# Patient Record
Sex: Female | Born: 1990 | Race: White | Hispanic: No | Marital: Single | Smoking: Never smoker
Health system: Southern US, Community
[De-identification: ages and names within clinical notes are randomized; demographics above are authoritative.]

## PROBLEM LIST (undated history)

## (undated) DIAGNOSIS — F329 Major depressive disorder, single episode, unspecified: Secondary | ICD-10-CM

## (undated) DIAGNOSIS — M419 Scoliosis, unspecified: Secondary | ICD-10-CM

## (undated) DIAGNOSIS — F419 Anxiety disorder, unspecified: Secondary | ICD-10-CM

## (undated) DIAGNOSIS — F32A Depression, unspecified: Secondary | ICD-10-CM

## (undated) HISTORY — PX: OOPHORECTOMY: SHX86

---

## 2012-06-05 ENCOUNTER — Emergency Department (HOSPITAL_COMMUNITY)
Admission: EM | Admit: 2012-06-05 | Discharge: 2012-06-05 | Disposition: A | Discharge status: Home / Self Care | Payer: Medicaid Other | Attending: Emergency Medicine | Admitting: Emergency Medicine

## 2012-06-05 ENCOUNTER — Encounter (HOSPITAL_COMMUNITY): Payer: Self-pay | Admitting: *Deleted

## 2012-06-05 DIAGNOSIS — F411 Generalized anxiety disorder: Secondary | ICD-10-CM | POA: Insufficient documentation

## 2012-06-05 DIAGNOSIS — Z888 Allergy status to other drugs, medicaments and biological substances status: Secondary | ICD-10-CM | POA: Insufficient documentation

## 2012-06-05 DIAGNOSIS — F3289 Other specified depressive episodes: Secondary | ICD-10-CM | POA: Insufficient documentation

## 2012-06-05 DIAGNOSIS — F329 Major depressive disorder, single episode, unspecified: Secondary | ICD-10-CM | POA: Insufficient documentation

## 2012-06-05 DIAGNOSIS — R0789 Other chest pain: Secondary | ICD-10-CM

## 2012-06-05 DIAGNOSIS — R071 Chest pain on breathing: Secondary | ICD-10-CM | POA: Insufficient documentation

## 2012-06-05 DIAGNOSIS — M940 Chondrocostal junction syndrome [Tietze]: Secondary | ICD-10-CM | POA: Insufficient documentation

## 2012-06-05 HISTORY — DX: Scoliosis, unspecified: M41.9

## 2012-06-05 HISTORY — DX: Anxiety disorder, unspecified: F41.9

## 2012-06-05 HISTORY — DX: Major depressive disorder, single episode, unspecified: F32.9

## 2012-06-05 HISTORY — DX: Depression, unspecified: F32.A

## 2012-06-05 MED ORDER — FAMOTIDINE 20 MG PO TABS: 20.0000 mg | ORAL_TABLET | Freq: Two times a day (BID) | ORAL | Status: DC

## 2012-06-05 MED ORDER — FAMOTIDINE 20 MG PO TABS
20.0000 mg | ORAL_TABLET | Freq: Once | ORAL | Status: AC
  Administered 2012-06-05: 20 mg via ORAL
  Filled 2012-06-05: qty 1

## 2012-06-05 MED ORDER — PREDNISONE 20 MG PO TABS: ORAL_TABLET | ORAL | Status: DC

## 2012-06-05 MED ORDER — CYCLOBENZAPRINE HCL 10 MG PO TABS: 10.0000 mg | ORAL_TABLET | Freq: Three times a day (TID) | ORAL | Status: DC | PRN

## 2012-06-05 MED ORDER — PREDNISONE 20 MG PO TABS
60.0000 mg | ORAL_TABLET | Freq: Once | ORAL | Status: AC
  Administered 2012-06-05: 60 mg via ORAL
  Filled 2012-06-05: qty 3

## 2012-06-05 MED ORDER — CYCLOBENZAPRINE HCL 10 MG PO TABS
10.0000 mg | ORAL_TABLET | Freq: Once | ORAL | Status: AC
  Administered 2012-06-05: 10 mg via ORAL
  Filled 2012-06-05: qty 1

## 2012-06-05 NOTE — ED Notes (Signed)
Dr. Lynelle Doctor in to assess patient at this time.

## 2012-06-05 NOTE — ED Notes (Signed)
Discharge instructions given and reviewed with patient and father.  Both verbalized understanding to follow up as needed for further pain or fever.  Patient states she is unable to move her left leg; pulling leg with arms to get off bed.

## 2012-06-05 NOTE — ED Notes (Signed)
Patient significant other came out of room and asking for the name of the doctor that saw his fiance. Patient's significant other voicing that he is upset that the patient back pain and numbness and tingling was not addressed. Also states that patient's pain was not taken care of and that he is "going to be after the doctor" if something happens to her over night and they have to come back to a hospital. Delay and situation explained to family member.

## 2012-06-05 NOTE — ED Provider Notes (Cosign Needed)
History    This chart was scribed for Ward Givens, MD, MD by Smitty Pluck. The patient was seen in room APA06 and the patient's care was started at 9:44PM.   CSN: [id number]  Arrival date & time 06/05/12  1908     Chief Complaint  Patient presents with  . Chest Pain    (Consider location/radiation/quality/duration/timing/severity/associated sxs/prior treatment) The history is provided by the patient. No language interpreter was used.   Olivia Floyd is a 21 y.o. female who presents to the Emergency Department complaining of constant, moderate substernal chest pain onset 2 days ago. Pt reports the pain is sharp and throbbing. Reports lifting objects and breathing deeply aggravate the pain. Pt reports that she has SOB but thinks it is due to anxiety.She denies cough, fever.  Pt went to [facility] in [place] for back pain 3 weeks ago. She was also seen in another ED in [place] and was told she needed to go to pain management. States she is visiting a sick family member in [place]. Pt denies smoking cigarettes and drinking alcohol. Pt goes to [facility] for effexor medication. States she was last there 1 month ago.   PCP OOT  Past Medical History  Diagnosis Date  . Scoliosis   . Depression   . Anxiety     Past Surgical History  Procedure Date  . Oophorectomy     No family history on file.  History  Substance Use Topics  . Smoking status: Never Smoker   . Smokeless tobacco: Not on file  . Alcohol Use: No  lives with GM unemployed  OB History    Grav Para Term Preterm Abortions TAB SAB Ect Mult Living                  Review of Systems  Respiratory: Positive for shortness of breath.   Cardiovascular: Positive for chest pain.  All other systems reviewed and are negative.    Allergies  Ibuprofen  Home Medications   Current Outpatient Rx  Name Route Sig Dispense Refill  . ACETAMINOPHEN 500 MG PO TABS Oral Take 1,000 mg by  mouth daily as needed. *Taken as needed for pain*    . PERCOCET PO Oral Take 2 tablets by mouth every 4 (four) hours as needed. For pain    . UNKNOWN TO PATIENT Oral Take 1 tablet by mouth daily. BIRTH CONTROL-NAME IN UNKNOWN (Lo?)    . VENLAFAXINE HCL ER 37.5 MG PO CP24 Oral Take 37.5 mg by mouth daily.    . CYCLOBENZAPRINE HCL 10 MG PO TABS Oral Take 1 tablet (10 mg total) by mouth 3 (three) times daily as needed for muscle spasms. 30 tablet 0  . FAMOTIDINE 20 MG PO TABS Oral Take 1 tablet (20 mg total) by mouth 2 (two) times daily. 18 tablet 0  . PREDNISONE 20 MG PO TABS  Take 3 po QD x 2d starting tomorrow, then 2 po QD x 3d then 1 po QD x 3d 15 tablet 0    BP 125/90  Pulse 86  Temp 98.3 F (36.8 C) (Oral)  Resp 20  Ht 5\' 6"  (1.676 m)  Wt 131 lb (59.421 kg)  BMI 21.14 kg/m2  SpO2 100%  LMP 05/29/2012  Vital signs normal    Physical Exam  Nursing note and vitals reviewed. Constitutional: She is oriented to person, place, and time. She appears well-developed and well-nourished.  Non-toxic appearance. She does not appear ill. No distress.  Sleeping, had to be awakened to be examined  HENT:  Head: Normocephalic and atraumatic.  Right Ear: External ear normal.  Left Ear: External ear normal.  Nose: Nose normal. No mucosal edema or rhinorrhea.  Mouth/Throat: Oropharynx is clear and moist and mucous membranes are normal. No dental abscesses or uvula swelling.  Eyes: Conjunctivae normal and EOM are normal. Pupils are equal, round, and reactive to light.  Neck: Normal range of motion and full passive range of motion without pain. Neck supple.  Cardiovascular: Normal rate, regular rhythm and normal heart sounds.  Exam reveals no gallop and no friction rub.   No murmur heard. Pulmonary/Chest: Effort normal and breath sounds normal. No respiratory distress. She has no wheezes. She has no rhonchi. She has no rales. She exhibits tenderness (costocondral junctions bilateral with  reproducible pain upon palpation  ). She exhibits no crepitus.  Abdominal: Soft. Normal appearance and bowel sounds are normal. She exhibits no distension. There is no tenderness. There is no rebound and no guarding.  Musculoskeletal: Normal range of motion. She exhibits no edema and no tenderness.       Moves all extremities well.   Neurological: She is alert and oriented to person, place, and time. She has normal strength. No cranial nerve deficit.  Skin: Skin is warm, dry and intact. No rash noted. No erythema. No pallor.  Psychiatric: She has a normal mood and affect. Her speech is normal and behavior is normal. Her mood appears not anxious.    ED Course  Procedures (including critical care time) DIAGNOSTIC STUDIES: Oxygen Saturation is 100% on room air, normal by my interpretation.    COORDINATION OF CARE: 9:51 PM Discussed ED treatment with pt  9:51 PM Ordered:   Medications  predniSONE (DELTASONE) tablet 60 mg (60 mg Oral Given 06/05/12 2157)  famotidine (PEPCID) tablet 20 mg (20 mg Oral Given 06/05/12 2157)  cyclobenzaprine (FLEXERIL) tablet 10 mg (10 mg Oral Given 06/05/12 2157)   NCCSR shows 6 narcotic scripts in 6 months most in past 6 weeks.    Date: 06/05/2012  Rate: 63  Rhythm: normal sinus rhythm  QRS Axis: normal  Intervals: normal  ST/T Wave abnormalities: normal  Conduction Disutrbances:none  Narrative Interpretation:   Old EKG Reviewed: none available     1. Chest wall pain   2. Costochondritis     New Prescriptions   CYCLOBENZAPRINE (FLEXERIL) 10 MG TABLET    Take 1 tablet (10 mg total) by mouth 3 (three) times daily as needed for muscle spasms.   FAMOTIDINE (PEPCID) 20 MG TABLET    Take 1 tablet (20 mg total) by mouth 2 (two) times daily.   PREDNISONE (DELTASONE) 20 MG TABLET    Take 3 po QD x 2d starting tomorrow, then 2 po QD x 3d then 1 po QD x 3d    Plan discharge  Devoria Albe, MD, FACEP   MDM   I personally performed the services described  in this documentation, which was scribed in my presence. The recorded information has been reviewed and considered.  Devoria Albe, MD, Armando Gang    Ward Givens, MD 06/05/12 2226

## 2012-06-05 NOTE — ED Notes (Signed)
Pt reports central chest pain that started 2 days ago. Weakness to the left leg. Pt has some nausea, denies vomiting.

## 2012-06-05 NOTE — ED Notes (Signed)
Patient now reporting that her left arm is numb and tingling.

## 2012-07-28 ENCOUNTER — Encounter (HOSPITAL_COMMUNITY): Payer: Self-pay | Admitting: Emergency Medicine

## 2012-07-28 ENCOUNTER — Emergency Department (HOSPITAL_COMMUNITY)
Admission: EM | Admit: 2012-07-28 | Discharge: 2012-07-28 | Disposition: A | Discharge status: Home / Self Care | Payer: Self-pay | Attending: Emergency Medicine | Admitting: Emergency Medicine

## 2012-07-28 ENCOUNTER — Emergency Department (HOSPITAL_COMMUNITY): Payer: Self-pay

## 2012-07-28 DIAGNOSIS — S90121A Contusion of right lesser toe(s) without damage to nail, initial encounter: Secondary | ICD-10-CM

## 2012-07-28 DIAGNOSIS — Y9301 Activity, walking, marching and hiking: Secondary | ICD-10-CM | POA: Insufficient documentation

## 2012-07-28 DIAGNOSIS — W108XXA Fall (on) (from) other stairs and steps, initial encounter: Secondary | ICD-10-CM | POA: Insufficient documentation

## 2012-07-28 DIAGNOSIS — Y929 Unspecified place or not applicable: Secondary | ICD-10-CM | POA: Insufficient documentation

## 2012-07-28 DIAGNOSIS — Z8659 Personal history of other mental and behavioral disorders: Secondary | ICD-10-CM | POA: Insufficient documentation

## 2012-07-28 DIAGNOSIS — IMO0001 Reserved for inherently not codable concepts without codable children: Secondary | ICD-10-CM | POA: Insufficient documentation

## 2012-07-28 DIAGNOSIS — S90129A Contusion of unspecified lesser toe(s) without damage to nail, initial encounter: Secondary | ICD-10-CM | POA: Insufficient documentation

## 2012-07-28 MED ORDER — ACETAMINOPHEN 325 MG PO TABS
650.0000 mg | ORAL_TABLET | Freq: Once | ORAL | Status: AC
  Administered 2012-07-28: 650 mg via ORAL
  Filled 2012-07-28: qty 2

## 2012-07-28 NOTE — ED Notes (Signed)
Pt states she fell going down steps and injured her 2nd toe on the foot.

## 2012-07-28 NOTE — ED Notes (Signed)
edpa in with pt prior to nurse

## 2012-07-28 NOTE — ED Notes (Signed)
Patient transported to X-ray 

## 2012-07-29 NOTE — ED Provider Notes (Signed)
History     CSN: [id number]  Arrival date & time 07/28/12  7829   First MD Initiated Contact with Patient 07/28/12 1909      Chief Complaint  Patient presents with  . Fall  . Toe Pain    (Consider location/radiation/quality/duration/timing/severity/associated sxs/prior treatment) HPI Comments: Olivia Floyd tripped going down several steps prior to arrival and stubbed her right second toe on the landing causing severe pain and swelling.  She denies other injury except for this toe and her distal foot.  Pain is constant and throbbing and does not radiate.  She has taken no medications or treatments for this injury prior to arrival.  The history is provided by the patient.    Past Medical History  Diagnosis Date  . Scoliosis   . Depression   . Anxiety     Past Surgical History  Procedure Date  . Oophorectomy     History reviewed. No pertinent family history.  History  Substance Use Topics  . Smoking status: Never Smoker   . Smokeless tobacco: Not on file  . Alcohol Use: No    OB History    Grav Para Term Preterm Abortions TAB SAB Ect Mult Living                  Review of Systems  Musculoskeletal: Positive for arthralgias.  Skin: Negative for wound.  Neurological: Negative for weakness and numbness.    Allergies  Ibuprofen  Home Medications  No current outpatient prescriptions on file.  BP 101/70  Pulse 71  Temp 97.3 F (36.3 C) (Oral)  Resp 20  Ht 5\' 4"  (1.626 m)  Wt 125 lb (56.7 kg)  BMI 21.46 kg/m2  SpO2 100%  LMP 07/25/2012  Physical Exam  Constitutional: She appears well-developed and well-nourished.  HENT:  Head: Atraumatic.  Neck: Normal range of motion.  Cardiovascular:       Pulses equal bilaterally  Musculoskeletal: She exhibits tenderness.       Feet:       Right distal second toe appears erythematous and slightly swollen.  Skin is intact.  Pedal pulses intact.  Less than 3 second cap refill of his toe.  Neurological: She  is alert. She has normal strength. She displays normal reflexes. No sensory deficit.       Equal strength  Skin: Skin is warm and dry.  Psychiatric: She has a normal mood and affect.    ED Course  Procedures (including critical care time)  Labs Reviewed - No data to display Dg Foot Complete Right  07/28/2012  *RADIOLOGY REPORT*  Clinical Data: Tripped injuring second toe  RIGHT FOOT COMPLETE - 3+ VIEW  Comparison: None  Findings: Bone mineralization normal. Joint spaces preserved. No fracture, dislocation, or bone destruction.  IMPRESSION: Normal exam.   Original Report Authenticated By: Ulyses Southward, M.D.      1. Contusion of toe of right foot       MDM  X-rays reviewed normal.  Patient was placed in a postop shoe.  Encouraged ice, elevation.  Recommend Tylenol for pain relief.  Followup with PCP for recheck if not improving over the next week.        Burgess Amor, [place] 07/29/12 [phone]

## 2012-07-29 NOTE — ED Provider Notes (Signed)
Medical screening examination/treatment/procedure(s) were performed by non-physician practitioner and as supervising physician I was immediately available for consultation/collaboration.   Benny Lennert, MD 07/29/12 [phone]

## 2013-10-07 IMAGING — CR DG FOOT COMPLETE 3+V*R*
3 series · 3 of 3 positions shown · non-contrast
Comparison: None

CLINICAL DATA: Tripped injuring second toe

RIGHT FOOT COMPLETE - 3+ VIEW

[view not recorded (1 of 3)]
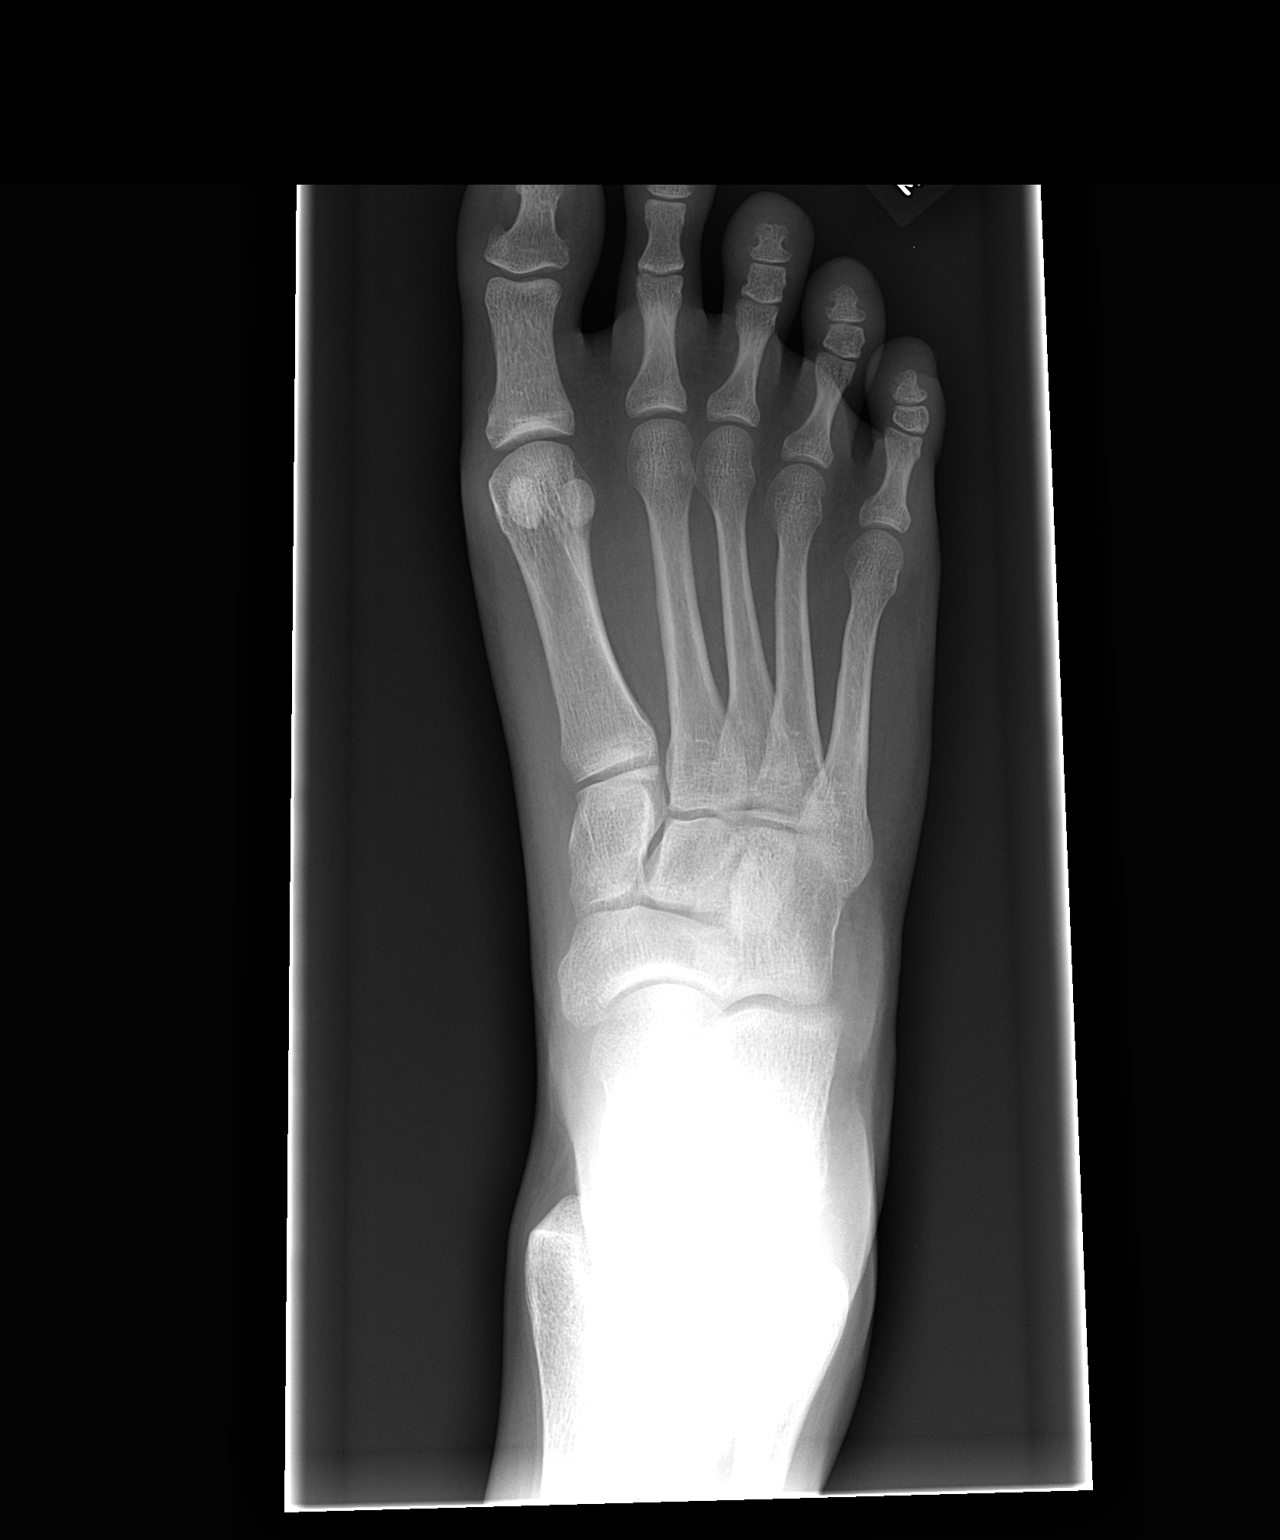

[view not recorded (2 of 3)]
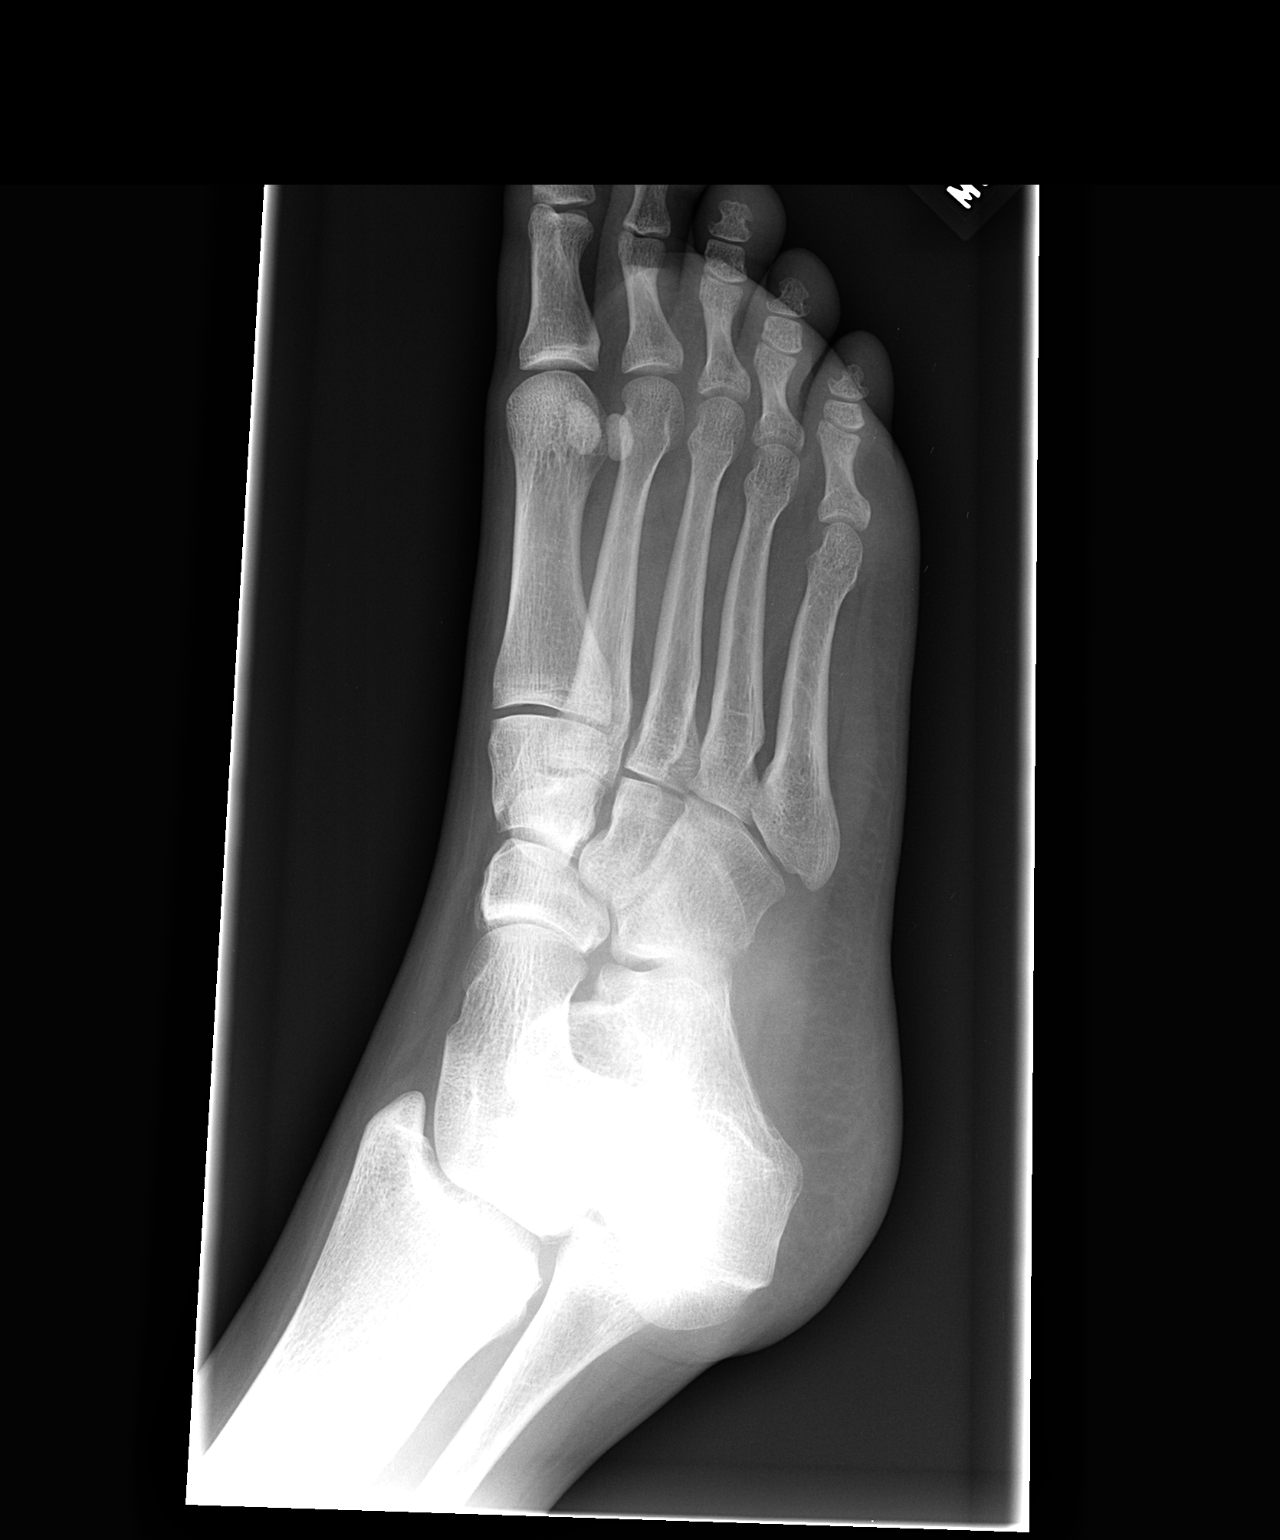

[view not recorded (3 of 3)]
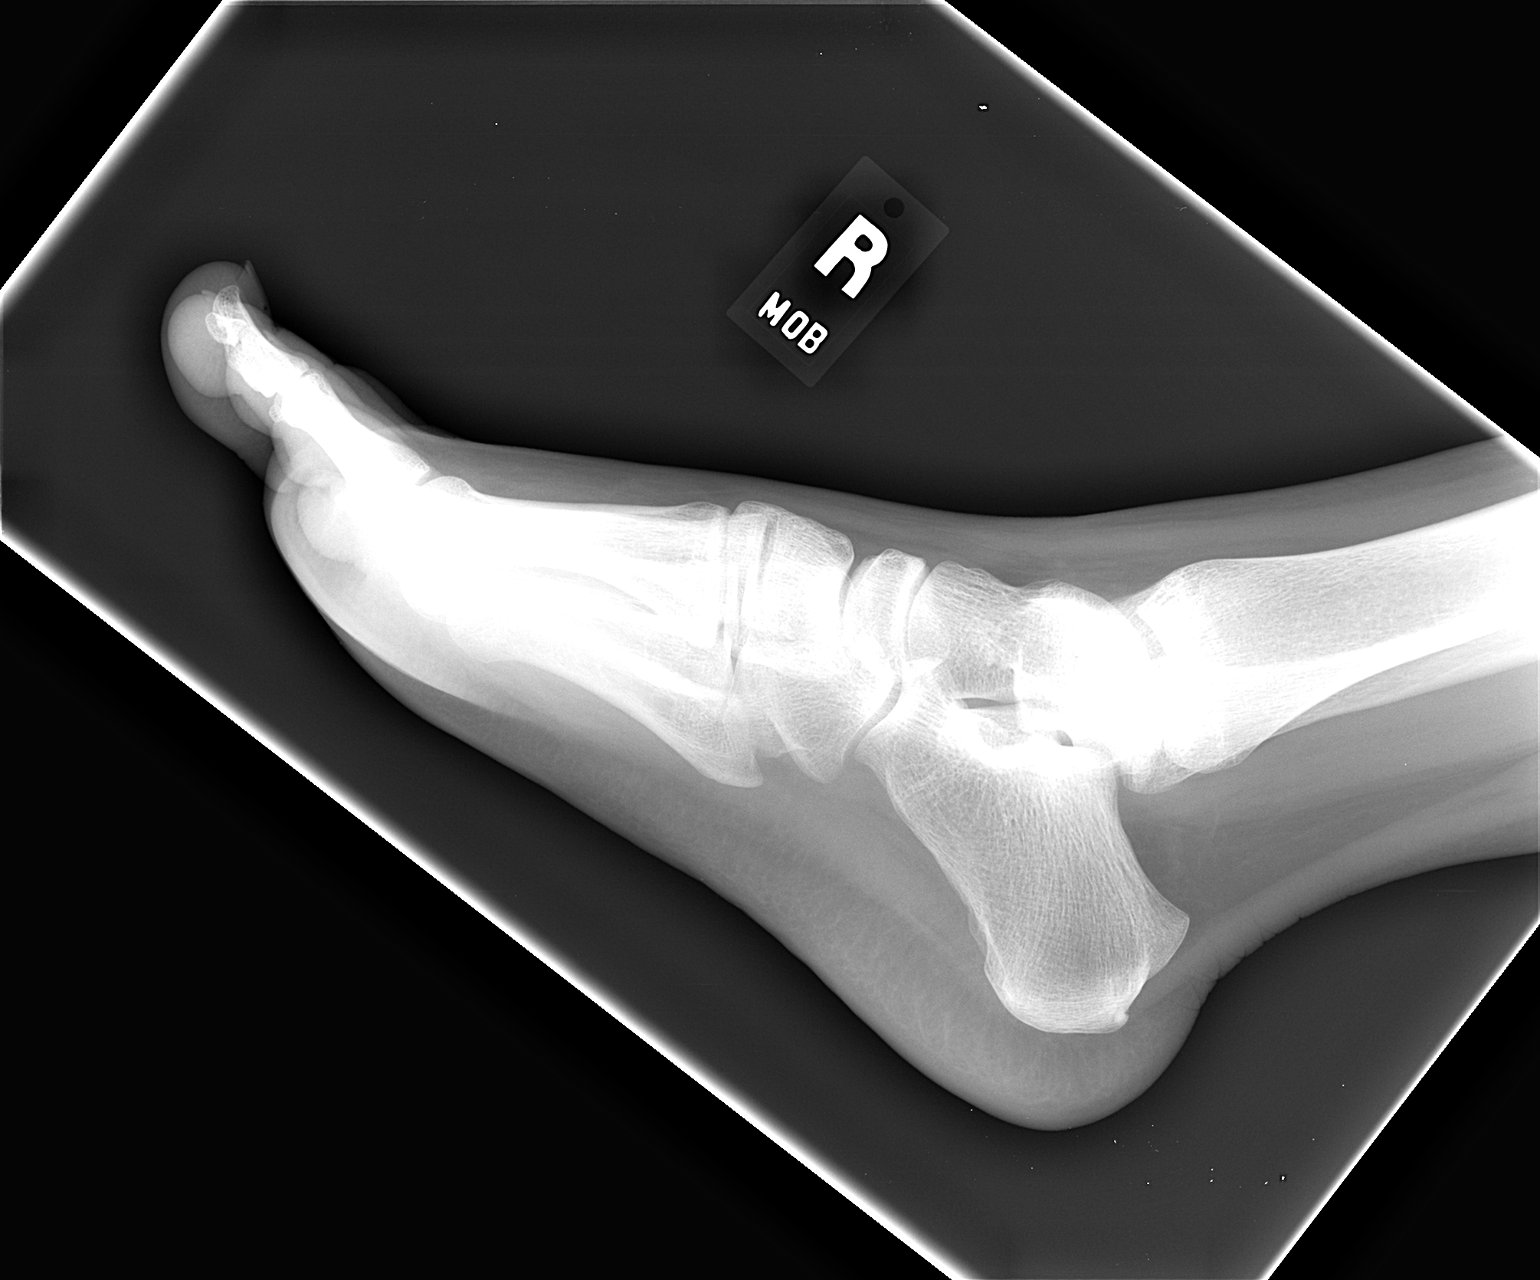

[3 of 3 positions shown; findings below may reference images not displayed]

FINDINGS: Bone mineralization normal.
Joint spaces preserved.
No fracture, dislocation, or bone destruction.
IMPRESSION: Normal exam.

## 2016-06-06 NOTE — Progress Notes (Deleted)
  Tawana ScaleZach Aimy Sweeting D.O. [facility] [address] [place], [place] [address] Phone: [phone] Subjective:    I'm seeing this patient by the request  of:    CC:   OZH:[id number]:Subjective  Lindalou HoseBrittany Appleton is a 25 y.o. female coming in with complaint of ***     Past Medical History:  Diagnosis Date  . Anxiety   . Depression   . Scoliosis    Past Surgical History:  Procedure Laterality Date  . OOPHORECTOMY     Social History   Social History  . Marital status: Single    Spouse name: N/A  . Number of children: N/A  . Years of education: N/A   Social History Main Topics  . Smoking status: Never Smoker  . Smokeless tobacco: Not on file  . Alcohol use No  . Drug use: No  . Sexual activity: Not on file   Other Topics Concern  . Not on file   Social History Narrative  . No narrative on file   Allergies  Allergen Reactions  . Ibuprofen Diarrhea and Other (See Comments)    REACTION: causes burning in stomach in addition to pain   No family history on file.  Past medical history, social, surgical and family history all reviewed in electronic medical record.  No pertanent information unless stated regarding to the chief complaint.   Review of Systems: No headache, visual changes, nausea, vomiting, diarrhea, constipation, dizziness, abdominal pain, skin rash, fevers, chills, night sweats, weight loss, swollen lymph nodes, body aches, joint swelling, muscle aches, chest pain, shortness of breath, mood changes.   Objective  There were no vitals taken for this visit.  General: No apparent distress alert and oriented x3 mood and affect normal, dressed appropriately.  HEENT: Pupils equal, extraocular movements intact  Respiratory: Patient's speak in full sentences and does not appear short of breath  Cardiovascular: No lower extremity edema, non tender, no erythema  Skin: Warm dry intact with no signs of infection or rash on extremities or on axial skeleton.  Abdomen: Soft  nontender  Neuro: Cranial nerves II through XII are intact, neurovascularly intact in all extremities with 2+ DTRs and 2+ pulses.  Lymph: No lymphadenopathy of posterior or anterior cervical chain or axillae bilaterally.  Gait normal with good balance and coordination.  MSK:  Non tender with full range of motion and good stability and symmetric strength and tone of shoulders, elbows, wrist, hip, knee and ankles bilaterally.     Impression and Recommendations:     This case required medical decision making of moderate complexity.      Note: This dictation was prepared with Dragon dictation along with smaller phrase technology. Any transcriptional errors that result from this process are unintentional.

## 2016-06-07 ENCOUNTER — Ambulatory Visit: Payer: Self-pay | Admitting: Family Medicine
